# Patient Record
Sex: Male | Born: 1970 | Race: Black or African American | Hispanic: No | Marital: Single | Smoking: Never smoker
Health system: Southern US, Community
[De-identification: ages and names within clinical notes are randomized; demographics above are authoritative.]

## PROBLEM LIST (undated history)

## (undated) DIAGNOSIS — E119 Type 2 diabetes mellitus without complications: Secondary | ICD-10-CM

---

## 2016-07-19 ENCOUNTER — Emergency Department (HOSPITAL_BASED_OUTPATIENT_CLINIC_OR_DEPARTMENT_OTHER): Payer: 59

## 2016-07-19 ENCOUNTER — Encounter (HOSPITAL_BASED_OUTPATIENT_CLINIC_OR_DEPARTMENT_OTHER): Payer: Self-pay | Admitting: Emergency Medicine

## 2016-07-19 ENCOUNTER — Observation Stay (HOSPITAL_BASED_OUTPATIENT_CLINIC_OR_DEPARTMENT_OTHER)
Admission: EM | Admit: 2016-07-19 | Discharge: 2016-07-21 | Disposition: A | Discharge status: Home / Self Care | Payer: 59 | Attending: Surgery | Admitting: Surgery

## 2016-07-19 DIAGNOSIS — Z79899 Other long term (current) drug therapy: Secondary | ICD-10-CM | POA: Insufficient documentation

## 2016-07-19 DIAGNOSIS — E785 Hyperlipidemia, unspecified: Secondary | ICD-10-CM | POA: Insufficient documentation

## 2016-07-19 DIAGNOSIS — Z7982 Long term (current) use of aspirin: Secondary | ICD-10-CM | POA: Diagnosis not present

## 2016-07-19 DIAGNOSIS — N400 Enlarged prostate without lower urinary tract symptoms: Secondary | ICD-10-CM | POA: Insufficient documentation

## 2016-07-19 DIAGNOSIS — Z7984 Long term (current) use of oral hypoglycemic drugs: Secondary | ICD-10-CM | POA: Insufficient documentation

## 2016-07-19 DIAGNOSIS — K3580 Unspecified acute appendicitis: Secondary | ICD-10-CM | POA: Diagnosis not present

## 2016-07-19 DIAGNOSIS — E119 Type 2 diabetes mellitus without complications: Secondary | ICD-10-CM | POA: Diagnosis not present

## 2016-07-19 DIAGNOSIS — K37 Unspecified appendicitis: Secondary | ICD-10-CM | POA: Diagnosis present

## 2016-07-19 DIAGNOSIS — R109 Unspecified abdominal pain: Secondary | ICD-10-CM | POA: Diagnosis present

## 2016-07-19 HISTORY — DX: Type 2 diabetes mellitus without complications: E11.9

## 2016-07-19 LAB — URINE MICROSCOPIC-ADD ON: Bacteria, UA: NONE SEEN

## 2016-07-19 LAB — BASIC METABOLIC PANEL
Anion gap: 10 (ref 5–15)
BUN: 12 mg/dL (ref 6–20)
CALCIUM: 9.3 mg/dL (ref 8.9–10.3)
CHLORIDE: 95 mmol/L — AB (ref 101–111)
CO2: 27 mmol/L (ref 22–32)
CREATININE: 0.82 mg/dL (ref 0.61–1.24)
GFR calc Af Amer: >60 mL/min (ref >60)
GFR calc non Af Amer: >60 mL/min (ref >60)
Glucose, Bld: 291 mg/dL — ABNORMAL HIGH (ref 65–99)
Potassium: 4.3 mmol/L (ref 3.5–5.1)
SODIUM: 132 mmol/L — AB (ref 135–145)

## 2016-07-19 LAB — CBC WITH DIFFERENTIAL/PLATELET
BASOS PCT: 0 %
Basophils Absolute: 0 10*3/uL (ref 0.0–0.1)
EOS ABS: 0 10*3/uL (ref 0.0–0.7)
Eosinophils Relative: 0 %
HEMATOCRIT: 44 % (ref 39.0–52.0)
HEMOGLOBIN: 15.6 g/dL (ref 13.0–17.0)
LYMPHS ABS: 1.5 10*3/uL (ref 0.7–4.0)
Lymphocytes Relative: 11 %
MCH: 26.8 pg (ref 26.0–34.0)
MCHC: 35.5 g/dL (ref 30.0–36.0)
MCV: 75.6 fL — ABNORMAL LOW (ref 78.0–100.0)
MONO ABS: 1.2 10*3/uL — AB (ref 0.1–1.0)
MONOS PCT: 9 %
NEUTROS PCT: 80 %
Neutro Abs: 10.5 10*3/uL — ABNORMAL HIGH (ref 1.7–7.7)
Platelets: 349 10*3/uL (ref 150–400)
RBC: 5.82 MIL/uL — ABNORMAL HIGH (ref 4.22–5.81)
RDW: 12.4 % (ref 11.5–15.5)
WBC: 13.2 10*3/uL — ABNORMAL HIGH (ref 4.0–10.5)

## 2016-07-19 LAB — URINALYSIS, ROUTINE W REFLEX MICROSCOPIC
BILIRUBIN URINE: NEGATIVE
KETONES UR: 40 mg/dL — AB
Leukocytes, UA: NEGATIVE
Nitrite: NEGATIVE
PH: 5.5 (ref 5.0–8.0)
Protein, ur: 30 mg/dL — AB
SPECIFIC GRAVITY, URINE: 1.044 — AB (ref 1.005–1.030)

## 2016-07-19 MED ORDER — SODIUM CHLORIDE 0.9 % IV SOLN
Freq: Once | INTRAVENOUS | Status: AC
  Administered 2016-07-19: 23:00:00 via INTRAVENOUS

## 2016-07-19 MED ORDER — ONDANSETRON HCL 4 MG/2ML IJ SOLN
4.0000 mg | Freq: Once | INTRAMUSCULAR | Status: AC
  Administered 2016-07-19: 4 mg via INTRAVENOUS
  Filled 2016-07-19: qty 2

## 2016-07-19 MED ORDER — FENTANYL CITRATE (PF) 100 MCG/2ML IJ SOLN
100.0000 ug | Freq: Once | INTRAMUSCULAR | Status: AC
  Administered 2016-07-19: 100 ug via INTRAVENOUS
  Filled 2016-07-19: qty 2

## 2016-07-19 NOTE — ED Provider Notes (Addendum)
[facility] Provider Note   CSN: [id number] Arrival date & time: 07/19/16  2031 By signing my name below, I, Bridgette Habermann, attest that this documentation has been prepared under the direction and in the presence of Paula Libra, MD. Electronically Signed: Bridgette Habermann, ED Scribe. 07/19/16. 11:05 PM.  History   Chief Complaint Chief Complaint  Patient presents with  . Abdominal Pain    HPI Comments: Richard Flowers is a 45 y.o. male who presents to the Emergency Department complaining of gradually worsening, 8/10 cramping RLQ abdominal pain onset last night around midnight. Pt states pain is exacerbated with Movement or palpation palpation. Pt also has associated intermittent nausea and vomiting x1 episode. Pain is improved by lying still. Denies dysuria, hematuria, fever, diarrhea diaphoresis or chills.  The history is provided by the patient. No language interpreter was used.    Past Medical History:  Diagnosis Date  . Diabetes mellitus without complication (HCC)     There are no active problems to display for this patient.  History reviewed. No pertinent surgical history.   Home Medications    Prior to Admission medications   Medication Sig Start Date End Date Taking? Authorizing Provider  metFORMIN (GLUCOPHAGE) 500 MG tablet Take by mouth 2 (two) times daily with a meal.   Yes Historical Provider, MD    Family History History reviewed. No pertinent family history.  Social History Social History  Substance Use Topics  . Smoking status: Never Smoker  . Smokeless tobacco: Never Used  . Alcohol use Yes     Allergies   Review of patient's allergies indicates no known allergies.   Review of Systems Review of Systems  10 Systems reviewed and all are negative for acute change except as noted in the HPI.  Physical Exam Updated Vital Signs BP 120/82   Pulse 86   Temp 99.2 F (37.3 C) (Oral)   Resp 20   Ht 5\' 11"  (1.803 m)   Wt 194 lb (88 kg)   SpO2 93%    BMI 27.06 kg/m   Physical Exam General: Well-developed, well-nourished male in no acute distress; appearance consistent with age of record HENT: normocephalic; atraumatic Eyes: pupils equal, round and reactive to light; extraocular muscles intact Neck: supple Heart: regular rate and rhythm Lungs: clear to auscultation bilaterally Abdomen: soft; nondistended; RLQ tenderness without rebound; no masses or hepatosplenomegaly; bowel sounds present Extremities: No deformity; full range of motion; pulses normal Neurologic: Awake, alert and oriented; motor function intact in all extremities and symmetric; no facial droop Skin: Warm and dry Psychiatric: Normal mood and affect  ED Treatments / Results   Nursing notes and vitals signs, including pulse oximetry, reviewed.  Summary of this visit's results, reviewed by myself:  Labs:  Results for orders placed or performed during the hospital encounter of 07/19/16 (from the past 24 hour(s))  CBC with Differential/Platelet     Status: Abnormal   Collection Time: 07/19/16 11:10 PM  Result Value Ref Range   WBC 13.2 (H) 4.0 - 10.5 K/uL   RBC 5.82 (H) 4.22 - 5.81 MIL/uL   Hemoglobin 15.6 13.0 - 17.0 g/dL   HCT 78.2 95.6 - 21.3 %   MCV 75.6 (L) 78.0 - 100.0 fL   MCH 26.8 26.0 - 34.0 pg   MCHC 35.5 30.0 - 36.0 g/dL   RDW 08.6 57.8 - 46.9 %   Platelets 349 150 - 400 K/uL   Neutrophils Relative % 80 %   Neutro Abs 10.5 (H)  1.7 - 7.7 K/uL   Lymphocytes Relative 11 %   Lymphs Abs 1.5 0.7 - 4.0 K/uL   Monocytes Relative 9 %   Monocytes Absolute 1.2 (H) 0.1 - 1.0 K/uL   Eosinophils Relative 0 %   Eosinophils Absolute 0.0 0.0 - 0.7 K/uL   Basophils Relative 0 %   Basophils Absolute 0.0 0.0 - 0.1 K/uL  Basic metabolic panel     Status: Abnormal   Collection Time: 07/19/16 11:10 PM  Result Value Ref Range   Sodium 132 (L) 135 - 145 mmol/L   Potassium 4.3 3.5 - 5.1 mmol/L   Chloride 95 (L) 101 - 111 mmol/L   CO2 27 22 - 32 mmol/L   Glucose,  Bld 291 (H) 65 - 99 mg/dL   BUN 12 6 - 20 mg/dL   Creatinine, Ser 1.61 0.61 - 1.24 mg/dL   Calcium 9.3 8.9 - 09.6 mg/dL   GFR calc non Af Amer >60 >60 mL/min   GFR calc Af Amer >60 >60 mL/min   Anion gap 10 5 - 15  Urinalysis, Routine w reflex microscopic (not at [facility])     Status: Abnormal   Collection Time: 07/19/16 11:15 PM  Result Value Ref Range   Color, Urine YELLOW YELLOW   APPearance CLEAR CLEAR   Specific Gravity, Urine 1.044 (H) 1.005 - 1.030   pH 5.5 5.0 - 8.0   Glucose, UA >1000 (A) NEGATIVE mg/dL   Hgb urine dipstick SMALL (A) NEGATIVE   Bilirubin Urine NEGATIVE NEGATIVE   Ketones, ur 40 (A) NEGATIVE mg/dL   Protein, ur 30 (A) NEGATIVE mg/dL   Nitrite NEGATIVE NEGATIVE   Leukocytes, UA NEGATIVE NEGATIVE  Urine microscopic-add on     Status: Abnormal   Collection Time: 07/19/16 11:15 PM  Result Value Ref Range   Squamous Epithelial / LPF 0-5 (A) NONE SEEN   WBC, UA 0-5 0 - 5 WBC/hpf   RBC / HPF 0-5 0 - 5 RBC/hpf   Bacteria, UA NONE SEEN NONE SEEN    Imaging Studies: Ct Abdomen Pelvis W Contrast  Result Date: 07/20/2016 CLINICAL DATA:  Acute onset right lower quadrant abdominal pain and cramping. Nausea and vomiting. Mild leukocytosis. Initial encounter. EXAM: CT ABDOMEN AND PELVIS WITH CONTRAST TECHNIQUE: Multidetector CT imaging of the abdomen and pelvis was performed using the standard protocol following bolus administration of intravenous contrast. CONTRAST:  ISOVUE-300 IOPAMIDOL (ISOVUE-300) INJECTION 61% COMPARISON:  None. FINDINGS: Minimal bibasilar atelectasis is noted. The liver and spleen are unremarkable in appearance. The gallbladder is within normal limits. The pancreas and adrenal glands are unremarkable. Small bilateral renal cysts are noted. There is no evidence of hydronephrosis. No renal or ureteral stones are seen. No perinephric stranding is appreciated. No free fluid is identified. The small bowel is unremarkable in appearance. The stomach is  within normal limits. No acute vascular abnormalities are seen. Minimal calcification is seen along the abdominal aorta and its branches. The appendix is dilated to 1.2 cm in diameter, with surrounding soft tissue inflammation and trace fluid, compatible with acute appendicitis. The appendix is partially retrocecal in nature. There is no evidence of perforation or abscess formation at this time. The colon is partially filled with stool and is unremarkable in appearance. The bladder is moderately distended and grossly unremarkable. The prostate is mildly enlarged, measuring 5.0 cm in transverse dimension. No inguinal lymphadenopathy is seen. No acute osseous abnormalities are identified. IMPRESSION: 1. Acute appendicitis, with dilatation of the appendix to 1.2 cm  in diameter, with surrounding soft tissue inflammation and trace fluid. The appendix is partially retrocecal in nature. No evidence of perforation or abscess formation at this time. 2. Mildly enlarged prostate noted. 3. Small bilateral renal cysts seen. These results were called by telephone at the time of interpretation on 07/20/2016 at 1:00 am to Dr. Paula LibraJOHN Bengie Kaucher, who verbally acknowledged these results. Electronically Signed   By: Roanna RaiderJeffery  Chang M.D.   On: 07/20/2016 01:00    Procedures (including critical care time)   Final Clinical Impressions(s) / ED Diagnoses  1:14 AM Antibiotics ordered for acute appendicitis. Dr. Ovidio Kinavid Newman [facility]  Final diagnoses:  Uncomplicated acute appendicitis   I personally performed the services described in this documentation, which was scribed in my presence. The recorded information has been reviewed and is accurate.    Paula LibraJohn Ellysa Parrack, MD 07/20/16 [id number]    Paula LibraJohn Blyss Lugar, MD 07/20/16 [id number]

## 2016-07-19 NOTE — ED Triage Notes (Signed)
Pt states abd pain since last night with vomiting, pt NAD in traige

## 2016-07-19 NOTE — ED Notes (Signed)
C/o RUQ pain, onset last night. Last ate last night. Vomited x1 at 1030. Tried gas-x and a laxative at 1330 with some relief. Last BM at 1600 (hard). (denies: fever, vomiting, diarrhea, back pain, bleeding or other sx). Alert, NAD, calm, interactive. [facility] Read [facility]rivers[facility]

## 2016-07-20 ENCOUNTER — Emergency Department (HOSPITAL_COMMUNITY): Payer: 59 | Admitting: Anesthesiology

## 2016-07-20 ENCOUNTER — Encounter (HOSPITAL_COMMUNITY)
Admission: EM | Disposition: A | Discharge status: Home / Self Care | Payer: Self-pay | Source: Home / Self Care | Attending: Emergency Medicine

## 2016-07-20 ENCOUNTER — Encounter (HOSPITAL_COMMUNITY): Payer: Self-pay | Admitting: Anesthesiology

## 2016-07-20 DIAGNOSIS — K37 Unspecified appendicitis: Secondary | ICD-10-CM | POA: Diagnosis present

## 2016-07-20 HISTORY — PX: LAPAROSCOPIC APPENDECTOMY: SHX408

## 2016-07-20 LAB — GLUCOSE, CAPILLARY
GLUCOSE-CAPILLARY: 245 mg/dL — AB (ref 65–99)
GLUCOSE-CAPILLARY: 274 mg/dL — AB (ref 65–99)
GLUCOSE-CAPILLARY: 268 mg/dL — AB (ref 65–99)
GLUCOSE-CAPILLARY: 288 mg/dL — AB (ref 65–99)
Glucose-Capillary: 318 mg/dL — ABNORMAL HIGH (ref 65–99)

## 2016-07-20 LAB — CBG MONITORING, ED: GLUCOSE-CAPILLARY: 284 mg/dL — AB (ref 65–99)

## 2016-07-20 SURGERY — APPENDECTOMY, LAPAROSCOPIC
Anesthesia: General | Site: Abdomen

## 2016-07-20 MED ORDER — FENTANYL CITRATE (PF) 250 MCG/5ML IJ SOLN
INTRAMUSCULAR | Status: AC
  Filled 2016-07-20: qty 5

## 2016-07-20 MED ORDER — ONDANSETRON 4 MG PO TBDP: 4.0000 mg | ORAL_TABLET | Freq: Four times a day (QID) | ORAL | Status: DC | PRN

## 2016-07-20 MED ORDER — ONDANSETRON HCL 4 MG/2ML IJ SOLN
INTRAMUSCULAR | Status: AC
  Filled 2016-07-20: qty 2

## 2016-07-20 MED ORDER — IOPAMIDOL (ISOVUE-300) INJECTION 61%
100.0000 mL | Freq: Once | INTRAVENOUS | Status: AC | PRN
  Administered 2016-07-20: 100 mL via INTRAVENOUS

## 2016-07-20 MED ORDER — 0.9 % SODIUM CHLORIDE (POUR BTL) OPTIME
TOPICAL | Status: DC | PRN
  Administered 2016-07-20: 1000 mL

## 2016-07-20 MED ORDER — MIDAZOLAM HCL 2 MG/2ML IJ SOLN
INTRAMUSCULAR | Status: AC
  Filled 2016-07-20: qty 2

## 2016-07-20 MED ORDER — HYDROCODONE-ACETAMINOPHEN 5-325 MG PO TABS
1.0000 | ORAL_TABLET | ORAL | Status: DC | PRN
  Administered 2016-07-20: 2 via ORAL
  Administered 2016-07-21: 1 via ORAL
  Filled 2016-07-20: qty 2
  Filled 2016-07-20 (×2): qty 1

## 2016-07-20 MED ORDER — FENTANYL CITRATE (PF) 100 MCG/2ML IJ SOLN
INTRAMUSCULAR | Status: DC | PRN
  Administered 2016-07-20: 100 ug via INTRAVENOUS
  Administered 2016-07-20 (×2): 50 ug via INTRAVENOUS

## 2016-07-20 MED ORDER — SODIUM CHLORIDE 0.9 % IV BOLUS (SEPSIS): 1000.0000 mL | Freq: Once | INTRAVENOUS | Status: DC

## 2016-07-20 MED ORDER — KCL IN DEXTROSE-NACL 20-5-0.45 MEQ/L-%-% IV SOLN
INTRAVENOUS | Status: DC
  Administered 2016-07-20: 100 mL/h via INTRAVENOUS
  Administered 2016-07-20: 11:00:00 via INTRAVENOUS
  Filled 2016-07-20 (×3): qty 1000

## 2016-07-20 MED ORDER — PROPOFOL 10 MG/ML IV BOLUS
INTRAVENOUS | Status: DC | PRN
  Administered 2016-07-20: 200 mg via INTRAVENOUS

## 2016-07-20 MED ORDER — METFORMIN HCL 500 MG PO TABS
1000.0000 mg | ORAL_TABLET | Freq: Two times a day (BID) | ORAL | Status: DC
  Administered 2016-07-20: 1000 mg via ORAL
  Filled 2016-07-20 (×2): qty 2

## 2016-07-20 MED ORDER — HEPARIN SODIUM (PORCINE) 5000 UNIT/ML IJ SOLN
5000.0000 [IU] | Freq: Three times a day (TID) | INTRAMUSCULAR | Status: DC
  Administered 2016-07-20 – 2016-07-21 (×3): 5000 [IU] via SUBCUTANEOUS
  Filled 2016-07-20 (×2): qty 1

## 2016-07-20 MED ORDER — PROPOFOL 10 MG/ML IV BOLUS
INTRAVENOUS | Status: AC
  Filled 2016-07-20: qty 20

## 2016-07-20 MED ORDER — IBUPROFEN 200 MG PO TABS: 600.0000 mg | ORAL_TABLET | Freq: Four times a day (QID) | ORAL | Status: DC | PRN

## 2016-07-20 MED ORDER — HYDROMORPHONE HCL 1 MG/ML IJ SOLN: 0.2500 mg | INTRAMUSCULAR | Status: DC | PRN

## 2016-07-20 MED ORDER — SUGAMMADEX SODIUM 200 MG/2ML IV SOLN
INTRAVENOUS | Status: DC | PRN
  Administered 2016-07-20: 200 mg via INTRAVENOUS

## 2016-07-20 MED ORDER — ONDANSETRON HCL 4 MG/2ML IJ SOLN
INTRAMUSCULAR | Status: DC | PRN
  Administered 2016-07-20: 4 mg via INTRAVENOUS

## 2016-07-20 MED ORDER — ONDANSETRON HCL 4 MG/2ML IJ SOLN: 4.0000 mg | Freq: Four times a day (QID) | INTRAMUSCULAR | Status: DC | PRN

## 2016-07-20 MED ORDER — BUPIVACAINE-EPINEPHRINE 0.25% -1:200000 IJ SOLN
INTRAMUSCULAR | Status: DC | PRN
  Administered 2016-07-20: 30 mL

## 2016-07-20 MED ORDER — LACTATED RINGERS IR SOLN
Status: DC | PRN
  Administered 2016-07-20: 1000 mL

## 2016-07-20 MED ORDER — LIDOCAINE 2% (20 MG/ML) 5 ML SYRINGE
INTRAMUSCULAR | Status: AC
  Filled 2016-07-20: qty 10

## 2016-07-20 MED ORDER — METRONIDAZOLE IN NACL 5-0.79 MG/ML-% IV SOLN
500.0000 mg | Freq: Once | INTRAVENOUS | Status: AC
  Administered 2016-07-20: 500 mg via INTRAVENOUS
  Filled 2016-07-20: qty 100

## 2016-07-20 MED ORDER — SODIUM CHLORIDE 0.9 % IV SOLN
INTRAVENOUS | Status: DC | PRN
  Administered 2016-07-20 (×2): via INTRAVENOUS

## 2016-07-20 MED ORDER — MORPHINE SULFATE (PF) 2 MG/ML IV SOLN: 1.0000 mg | INTRAVENOUS | Status: DC | PRN

## 2016-07-20 MED ORDER — ROCURONIUM BROMIDE 10 MG/ML (PF) SYRINGE
PREFILLED_SYRINGE | INTRAVENOUS | Status: DC | PRN
  Administered 2016-07-20: 20 mg via INTRAVENOUS
  Administered 2016-07-20: 30 mg via INTRAVENOUS

## 2016-07-20 MED ORDER — DEXTROSE 5 % IV SOLN
2.0000 g | INTRAVENOUS | Status: DC
  Administered 2016-07-21: 2 g via INTRAVENOUS
  Filled 2016-07-20: qty 2

## 2016-07-20 MED ORDER — GLYCOPYRROLATE 0.2 MG/ML IV SOSY
PREFILLED_SYRINGE | INTRAVENOUS | Status: AC
  Filled 2016-07-20: qty 3

## 2016-07-20 MED ORDER — MEPERIDINE HCL 50 MG/ML IJ SOLN: 6.2500 mg | INTRAMUSCULAR | Status: DC | PRN

## 2016-07-20 MED ORDER — INSULIN ASPART 100 UNIT/ML ~~LOC~~ SOLN
0.0000 [IU] | Freq: Three times a day (TID) | SUBCUTANEOUS | Status: DC
  Administered 2016-07-20: 11 [IU] via SUBCUTANEOUS
  Administered 2016-07-20: 8 [IU] via SUBCUTANEOUS
  Administered 2016-07-21: 5 [IU] via SUBCUTANEOUS

## 2016-07-20 MED ORDER — DEXTROSE 5 % IV SOLN
2.0000 g | Freq: Once | INTRAVENOUS | Status: AC
Start: 2016-07-20 — End: 2016-07-20
  Administered 2016-07-20: 2 g via INTRAVENOUS
  Filled 2016-07-20: qty 2

## 2016-07-20 MED ORDER — METOCLOPRAMIDE HCL 5 MG/ML IJ SOLN
INTRAMUSCULAR | Status: AC
  Filled 2016-07-20: qty 2

## 2016-07-20 MED ORDER — BUPIVACAINE-EPINEPHRINE 0.25% -1:200000 IJ SOLN
INTRAMUSCULAR | Status: AC
  Filled 2016-07-20: qty 1

## 2016-07-20 MED ORDER — MIDAZOLAM HCL 5 MG/5ML IJ SOLN
INTRAMUSCULAR | Status: DC | PRN
  Administered 2016-07-20: 2 mg via INTRAVENOUS

## 2016-07-20 MED ORDER — FENTANYL CITRATE (PF) 100 MCG/2ML IJ SOLN
100.0000 ug | INTRAMUSCULAR | Status: DC | PRN
  Administered 2016-07-20: 100 ug via INTRAVENOUS
  Filled 2016-07-20: qty 2

## 2016-07-20 MED ORDER — METOCLOPRAMIDE HCL 5 MG/ML IJ SOLN
INTRAMUSCULAR | Status: DC | PRN
  Administered 2016-07-20: 10 mg via INTRAVENOUS

## 2016-07-20 MED ORDER — METRONIDAZOLE IN NACL 5-0.79 MG/ML-% IV SOLN
500.0000 mg | Freq: Three times a day (TID) | INTRAVENOUS | Status: DC
  Administered 2016-07-20 – 2016-07-21 (×3): 500 mg via INTRAVENOUS
  Filled 2016-07-20 (×3): qty 100

## 2016-07-20 MED ORDER — SUGAMMADEX SODIUM 200 MG/2ML IV SOLN
INTRAVENOUS | Status: AC
  Filled 2016-07-20: qty 2

## 2016-07-20 MED ORDER — LIDOCAINE 2% (20 MG/ML) 5 ML SYRINGE
INTRAMUSCULAR | Status: DC | PRN
  Administered 2016-07-20: 50 mg via INTRAVENOUS

## 2016-07-20 MED ORDER — PROMETHAZINE HCL 25 MG/ML IJ SOLN: 6.2500 mg | INTRAMUSCULAR | Status: DC | PRN

## 2016-07-20 MED ORDER — SUCCINYLCHOLINE CHLORIDE 200 MG/10ML IV SOSY
PREFILLED_SYRINGE | INTRAVENOUS | Status: DC | PRN
  Administered 2016-07-20: 100 mg via INTRAVENOUS

## 2016-07-20 SURGICAL SUPPLY — 35 items
APPLIER CLIP ROT 10 11.4 M/L (STAPLE)
BENZOIN TINCTURE PRP APPL 2/3 (GAUZE/BANDAGES/DRESSINGS) IMPLANT
CABLE HIGH FREQUENCY MONO STRZ (ELECTRODE) ×3 IMPLANT
CHLORAPREP W/TINT 26ML (MISCELLANEOUS) ×3 IMPLANT
CLIP APPLIE ROT 10 11.4 M/L (STAPLE) IMPLANT
CLOSURE WOUND 1/2 X4 (GAUZE/BANDAGES/DRESSINGS)
COVER SURGICAL LIGHT HANDLE (MISCELLANEOUS) ×3 IMPLANT
CUTTER FLEX LINEAR 45M (STAPLE) IMPLANT
DECANTER SPIKE VIAL GLASS SM (MISCELLANEOUS) ×3 IMPLANT
DRAPE LAPAROSCOPIC ABDOMINAL (DRAPES) ×3 IMPLANT
ELECT REM PT RETURN 9FT ADLT (ELECTROSURGICAL) ×3
ELECTRODE REM PT RTRN 9FT ADLT (ELECTROSURGICAL) ×1 IMPLANT
ENDOLOOP SUT PDS II  0 18 (SUTURE)
ENDOLOOP SUT PDS II 0 18 (SUTURE) IMPLANT
GLOVE SURG SIGNA 7.5 PF LTX (GLOVE) ×3 IMPLANT
GOWN STRL REUS W/TWL XL LVL3 (GOWN DISPOSABLE) ×6 IMPLANT
IRRIG SUCT STRYKERFLOW 2 WTIP (MISCELLANEOUS) ×3
IRRIGATION SUCT STRKRFLW 2 WTP (MISCELLANEOUS) ×1 IMPLANT
KIT BASIN OR (CUSTOM PROCEDURE TRAY) ×3 IMPLANT
LIQUID BAND (GAUZE/BANDAGES/DRESSINGS) ×3 IMPLANT
POUCH SPECIMEN RETRIEVAL 10MM (ENDOMECHANICALS) ×3 IMPLANT
RELOAD 45 VASCULAR/THIN (ENDOMECHANICALS) ×3 IMPLANT
RELOAD STAPLE TA45 3.5 REG BLU (ENDOMECHANICALS) IMPLANT
SCISSORS LAP 5X35 DISP (ENDOMECHANICALS) ×3 IMPLANT
SHEARS HARMONIC ACE PLUS 36CM (ENDOMECHANICALS) ×3 IMPLANT
SLEEVE XCEL OPT CAN 5 100 (ENDOMECHANICALS) ×3 IMPLANT
STRIP CLOSURE SKIN 1/2X4 (GAUZE/BANDAGES/DRESSINGS) IMPLANT
SUT MNCRL AB 4-0 PS2 18 (SUTURE) ×3 IMPLANT
SUT VIC AB 2-0 SH 18 (SUTURE) IMPLANT
TOWEL OR 17X26 10 PK STRL BLUE (TOWEL DISPOSABLE) ×3 IMPLANT
TOWEL OR NON WOVEN STRL DISP B (DISPOSABLE) ×3 IMPLANT
TRAY FOLEY W/METER SILVER 14FR (SET/KITS/TRAYS/PACK) ×3 IMPLANT
TRAY LAPAROSCOPIC (CUSTOM PROCEDURE TRAY) ×3 IMPLANT
TROCAR BLADELESS OPT 5 100 (ENDOMECHANICALS) ×3 IMPLANT
TROCAR XCEL BLUNT TIP 100MML (ENDOMECHANICALS) ×3 IMPLANT

## 2016-07-20 NOTE — ED Notes (Signed)
Pt to surgery with signed consent no respiratory or acute distress noted alert and oriented x 3 family with pt to surgery no reaction to medication noted.

## 2016-07-20 NOTE — Anesthesia Preprocedure Evaluation (Signed)
Anesthesia Evaluation  Patient identified by MRN, date of birth, ID band Patient awake    Reviewed: Allergy & Precautions, NPO status , Patient's Chart, lab work & pertinent test results  Airway Mallampati: II  TM Distance: >3 FB Neck ROM: Full    Dental no notable dental hx. (+) Teeth Intact   Pulmonary neg pulmonary ROS,    Pulmonary exam normal breath sounds clear to auscultation       Cardiovascular negative cardio ROS Normal cardiovascular exam Rhythm:Regular Rate:Normal     Neuro/Psych negative neurological ROS  negative psych ROS   GI/Hepatic Acute Appendicitis   Endo/Other  diabetes, Poorly Controlled, Type 2, Oral Hypoglycemic AgentsHyperlipidemia  Renal/GU   negative genitourinary   Musculoskeletal negative musculoskeletal ROS (+)   Abdominal   Peds  Hematology negative hematology ROS (+)   Anesthesia Other Findings   Reproductive/Obstetrics                               Chemistry      Component Value Date/Time   NA 132 (L) 07/19/2016 2310   K 4.3 07/19/2016 2310   CL 95 (L) 07/19/2016 2310   CO2 27 07/19/2016 2310   BUN 12 07/19/2016 2310   CREATININE 0.82 07/19/2016 2310      Component Value Date/Time   CALCIUM 9.3 07/19/2016 2310     Lab Results  Component Value Date   WBC 13.2 (H) 07/19/2016   HGB 15.6 07/19/2016   HCT 44.0 07/19/2016   MCV 75.6 (L) 07/19/2016   PLT 349 07/19/2016    Anesthesia Physical Anesthesia Plan  ASA: II and emergent  Anesthesia Plan: General   Post-op Pain Management:    Induction: Intravenous, Rapid sequence and Cricoid pressure planned  Airway Management Planned: Oral ETT  Additional Equipment:   Intra-op Plan:   Post-operative Plan: Extubation in OR  Informed Consent: I have reviewed the patients History and Physical, chart, labs and discussed the procedure including the risks, benefits and alternatives for the  proposed anesthesia with the patient or authorized representative who has indicated his/her understanding and acceptance.   Dental advisory given  Plan Discussed with: CRNA, Anesthesiologist and Surgeon  Anesthesia Plan Comments:         Anesthesia Quick Evaluation

## 2016-07-20 NOTE — Anesthesia Procedure Notes (Signed)
Procedure Name: Intubation Date/Time: 07/20/2016 7:20 AM Performed by: Ludwig LeanJONES, Shanine Kreiger C Pre-anesthesia Checklist: Patient identified, Emergency Drugs available, Suction available and Patient being monitored Patient Re-evaluated:Patient Re-evaluated prior to inductionOxygen Delivery Method: Circle system utilized Preoxygenation: Pre-oxygenation with 100% oxygen Intubation Type: IV induction, Rapid sequence and Cricoid Pressure applied Laryngoscope Size: Mac and 4 Grade View: Grade I Tube type: Oral Tube size: 7.5 mm Number of attempts: 1 Airway Equipment and Method: Oral airway Placement Confirmation: ETT inserted through vocal cords under direct vision,  positive ETCO2 and breath sounds checked- equal and bilateral Secured at: 22 cm Tube secured with: Tape Dental Injury: Teeth and Oropharynx as per pre-operative assessment

## 2016-07-20 NOTE — Anesthesia Postprocedure Evaluation (Signed)
Anesthesia Post Note  Patient: Richard ClicheAdmonda L Flowers  Procedure(s) Performed: Procedure(s) (LRB): APPENDECTOMY LAPAROSCOPIC (N/A)  Patient location during evaluation: PACU Anesthesia Type: General Level of consciousness: sedated Pain management: satisfactory to patient Vital Signs Assessment: post-procedure vital signs reviewed and stable Respiratory status: spontaneous breathing Cardiovascular status: stable Anesthetic complications: no    Last Vitals:  Vitals:   07/20/16 0936 07/20/16 1035  BP: 137/82 126/76  Pulse: 98 99  Resp: 16 16  Temp: 36.9 C 36.4 C    Last Pain:  Vitals:   07/20/16 1035  TempSrc: Oral  PainSc:                  Jiles GarterJACKSON,[name] [name]

## 2016-07-20 NOTE — ED Notes (Signed)
Pt cleaned self with chg wipes with new gown and new non slip socks.

## 2016-07-20 NOTE — Transfer of Care (Signed)
Immediate Anesthesia Transfer of Care Note  Patient: Richard Flowers  Procedure(s) Performed: Procedure(s): APPENDECTOMY LAPAROSCOPIC (N/A)  Patient Location: PACU  Anesthesia Type:General  Level of Consciousness: Patient easily awoken, sedated, comfortable, cooperative, following commands, responds to stimulation.   Airway & Oxygen Therapy: Patient spontaneously breathing, ventilating well, oxygen via simple oxygen mask.  Post-op Assessment: Report given to PACU RN, vital signs reviewed and stable, moving all extremities.   Post vital signs: Reviewed and stable.  Complications: No apparent anesthesia complications Last Vitals:  Vitals:   07/20/16 0413 07/20/16 0548  BP: 128/85 119/73  Pulse: 80 79  Resp: 18 18  Temp: 36.9 C 36.7 C    Last Pain:  Vitals:   07/20/16 0548  TempSrc: Oral  PainSc: 2          Complications: No apparent anesthesia complications

## 2016-07-20 NOTE — H&P (Signed)
Re:   Richard Flowers DOB:   January 20, 1971 MRN:   [id number]   [facility] Admission  ASSESSMENT AND PLAN: 1.  Appendicitis  I discussed with the patient the indications and risks of appendiceal surgery.  The primary risks of appendiceal surgery include, but are not limited to, bleeding, infection, bowel surgery, and open surgery.  There is also the risk that the patient may have continued symptoms after surgery.  We discussed the typical post-operative recovery course. I tried to answer the patient's questions.  2.  DM x 8 years  On pills only 3.  BPH   Chief Complaint  Patient presents with  . Abdominal Pain   REFERRING PHYSICIAN:  Dr. Lannette DonathLane Malpus, [facility]  HISTORY OF PRESENT ILLNESS: Richard Flowers is a 45 y.o. (DOB: January 20, 1971)  AA male whose [facility]. Girlfriend, Richard Flowers, and mother, Richard Flowers, at bedside.  He started having pain Saturday night.  He vomited x 3 Sunday AM.  He thought he may be constipated and took some oil laxative.  He has had no prior abdominal surgery.  He has no chronic GI complaints - no stomach, liver, or colon disease.  CT scan of abdomen - 07/20/2016 - 1. Acute appendicitis, with dilatation of the appendix to 1.2 cm in diameter, with surrounding soft tissue inflammation and trace fluid. The appendix is partially retrocecal in nature. No evidence of perforation or abscess formation at this time.  2. Mildly enlarged prostate noted.  3. Small bilateral renal cysts seen. WBC - 07/19/2016 - 13,200    Past Medical History:  Diagnosis Date  . Diabetes mellitus without complication (HCC)      History reviewed. No pertinent surgical history.    Current Facility-Administered Medications  Medication Dose Route Frequency Provider Last Rate Last Dose  . fentaNYL (SUBLIMAZE) injection 100 mcg  100 mcg Intravenous Q2H PRN Paula LibraJohn Molpus, MD   100 mcg at  07/20/16 [id number]   Current Outpatient Prescriptions  Medication Sig Dispense Refill  . aspirin EC 81 MG tablet Take 81 mg by mouth daily.    . metFORMIN (GLUCOPHAGE) 1000 MG tablet Take 1,000 mg by mouth 2 (two) times daily.     . pravastatin (PRAVACHOL) 40 MG tablet        No Known Allergies  REVIEW OF SYSTEMS: Skin:  No history of rash.  No history of abnormal moles. Infection:  No history of hepatitis or HIV.  No history of MRSA. Neurologic:  No history of stroke.  No history of seizure.  No history of headaches. Cardiac:  No history of hypertension. No history of heart disease.   Pulmonary:  Does not smoke cigarettes.  No asthma or bronchitis.  No OSA/CPAP.  Endocrine:  DM x 8 years - on a pill. No thyroid disease. Gastrointestinal:  See HPI Urologic:  No history of kidney stones.  No history of bladder infections. Musculoskeletal:  No history of joint or back disease. Hematologic:  No bleeding disorder.  No history of anemia.  Not anticoagulated. Psycho-social:  The patient is oriented.   The patient has no obvious psychologic or social impairment to understanding our conversation and plan.  SOCIAL and FAMILY HISTORY: Unmarried.  Girlfriend, Richard Flowers, and mother, Richard Flowers, at bedside. He has 2 children - 44 yo son, and 44 yo daughter - [place] in [place]. He works for [employer], making pills.  PHYSICAL  EXAM: BP 119/73 (BP Location: Left Arm)   Pulse 79   Temp 98.1 F (36.7 C) (Oral)   Resp 18   Ht 5\' 11"  (1.803 m)   Wt 88 kg (194 lb)   SpO2 98%   BMI 27.06 kg/m   General: WN AA M who is alert and generally healthy appearing.  HEENT: Normal. Pupils equal. Neck: Supple. No mass.  No thyroid mass. Lymph Nodes:  No supraclavicular or cervical nodes. Lungs: Clear to auscultation and symmetric breath sounds. Heart:  RRR. No murmur or rub. Abdomen: Soft. No mass. No hernia. Decreased bowel sounds.  No abdominal scars.  Tender RLQ, but no rebound. Rectal: Not  done. Extremities:  Good strength and ROM  in upper and lower extremities. Neurologic:  Grossly intact to motor and sensory function. Psychiatric: Has normal mood and affect. Behavior is normal.   DATA REVIEWED: Epic notes  Ovidio Kin, MD,  [facility] [address] [place]  [address]   [place], [place]    [address] Phone:  [phone] FAX:  [phone]

## 2016-07-20 NOTE — Op Note (Signed)
Re:   Richard Flowers DOB:   Dec 28, 1970 MRN:   [id number]                   FACILITY:  [facility]  DATE OF PROCEDURE: 07/20/2016                              OPERATIVE REPORT  PREOPERATIVE DIAGNOSIS:  Appendicitis  POSTOPERATIVE DIAGNOSIS:  Acute purulent appendicitis, retroperitoneal appendix.  [photo in chart]  PROCEDURE:  Laparoscopic appendectomy.  SURGEON:  Sandria Bales. Ezzard Standing, MD  ASSISTANT:  No first assistant.  ANESTHESIA:  General endotracheal.  Anesthesiologist: Cristela Blue, MD CRNA: Ludwig Lean, CRNA  ASA:  2E  ESTIMATED BLOOD LOSS:  Minimal.  DRAINS: none   SPECIMEN:   Appendix  COUNTS CORRECT:  YES  INDICATIONS FOR PROCEDURE: Richard Flowers is a 45 y.o. (DOB: [birth year]) AA male whose primary care doctor is KELLY,SAM, MD and comes to the OR for an appendectomy.   I discussed with the patient, the indications and potential complications of appendiceal surgery.  The potential complications include, but are not limited to, bleeding, open surgery, bowel resection, and the possibility of another diagnosis.  OPERATIVE NOTE:  The patient underwent a general endotracheal anesthetic as supervised by Anesthesiologist: Cristela Blue, MD CRNA: Ludwig Lean, CRNA, General, in room #1.  The patient was given Rocephin and Flagyl prior to the beginning of the procedure and the abdomen was prepped with ChloraPrep.   A time-out was held and surgical checklist run.  An infraumbilical incision was made with sharp dissection carried down to the abdominal cavity.  An 12 mm Hasson trocar was inserted through the infraumbilical incision and into the peritoneal cavity.  A 0 degree 5 mm laparoscope was inserted through a 12 mm Hasson trocar and the Hasson trocar secured with a 0 Vicryl suture.  I placed a 5 mm trocar in the right upper quadrant and 5 mm torcar in left lower quadrant and did abdominal exploration.    The right and left lobes of liver unremarkable.  Stomach was unremarkable.   The pelvic organs were unremarkable.  I saw no other intra-abdominal abnormality.  The patient had purulent appendicitis with the appendix located in the retroperitoneum and partially retrocecal at the right pelvic brim.  The mesentery of the appendix was divided with a Harmonic scalpel.  I had to mobilize the cecum to access the base of the appendix.  I got to the base of the appendix.  I then used a blue load 45 mm Ethicon Endo-GIA stapler and fired this across the base of the appendix.  I placed the appendix in EndoCatch bag and delivered the bag through the umbilical incision.  I irrigated the abdomen with 1,000 cc of saline.  After irrigating the abdomen, I then removed the trocars, in turn.  The umbilical port fascia was closed with 0 Vicryl suture.   I closed the skin each site with a 4-0 Monocryl suture and painted the wounds with LiquidBand.  I then injected a total of 30 mL of 0.25% Marcaine at the incisions.  Sponge and needle count were correct at the end of the case.  The patient was transferred to the recovery room in good condition.  The patient tolerated the procedure well and it depends on the patient's post op clinical course as to when the patient could be discharged.   Ovidio Kin, MD, [facility]  Surgery Pager: [phone] Office phone:  [phone]

## 2016-07-20 NOTE — ED Notes (Signed)
[facility]

## 2016-07-20 NOTE — ED Notes (Signed)
No respiratory or acute distress noted alert and oriented x 3 visitors at bedside pt went to restroom.

## 2016-07-20 NOTE — ED Notes (Addendum)
Patient arrives by [facility] in stable condition. VS 98.7-87-12 125/84 and 98% RA. CBG 284 per [facility]

## 2016-07-20 NOTE — Progress Notes (Signed)
Metformin administered as ordered, pt's family concerned if pt should receive metformin due to possible interaction with contrast. MD was paged, made aware with new orders received and carried out. Meah Jiron R McClean

## 2016-07-20 NOTE — ED Notes (Signed)
No respiratory or acute distress noted alert and oriented x 3 no reaction to medication noted visitors at bedside. 

## 2016-07-20 NOTE — Progress Notes (Signed)
PHARMACIST - PHYSICIAN COMMUNICATION DR Lucia Gaskins CONCERNING:  METFORMIN SAFE ADMINISTRATION POLICY  RECOMMENDATION: Metformin has been placed on DISCONTINUE (rejected order) STATUS and should be reordered only after any of the conditions below are ruled out.  Current Safety recommendations include avoiding metformin for a minimum of 48 hours after the patient's exposure to intravenous contrast media for the following conditions:  . eGFR < 60 ml/min  . Liver disease, alcoholism, heart failure, intra-arterial administration of contrast  DESCRIPTION:  The Pharmacy Committee has adopted a policy that restricts the use of metformin in hospitalized patients until all the contraindications to administration have been ruled out. Specific contraindications are: _0  Serum creatinine ? 1.5 for males _1  Serum creatinine ? 1.4 for females _2  Shock, acute MI, sepsis, hypoxemia, dehydration _3  Planned administration of intravenous iodinated contrast media when eGFR < 46m/min, Liver Disease, alcoholism, heart failure or intra-arterial administration of contrast _4  Heart Failure patients with low EF _5  Acute or chronic metabolic acidosis (including DKA)   TRomeo Rabon PharmD, pager [phone] 07/20/2016,6:31 PM.

## 2016-07-21 ENCOUNTER — Encounter (HOSPITAL_COMMUNITY): Payer: Self-pay | Admitting: Surgery

## 2016-07-21 ENCOUNTER — Encounter: Payer: Self-pay | Admitting: General Surgery

## 2016-07-21 LAB — GLUCOSE, CAPILLARY: Glucose-Capillary: 236 mg/dL — ABNORMAL HIGH (ref 65–99)

## 2016-07-21 MED ORDER — METFORMIN HCL 1000 MG PO TABS: ORAL_TABLET | ORAL | Status: DC

## 2016-07-21 MED ORDER — METFORMIN HCL 1000 MG PO TABS: ORAL_TABLET | ORAL | Status: AC

## 2016-07-21 MED ORDER — ACETAMINOPHEN 325 MG PO TABS: ORAL_TABLET | ORAL | Status: AC

## 2016-07-21 MED ORDER — ACETAMINOPHEN 325 MG PO TABS: 650.0000 mg | ORAL_TABLET | ORAL | Status: DC | PRN

## 2016-07-21 MED ORDER — HYDROCODONE-ACETAMINOPHEN 5-325 MG PO TABS: 1.0000 | ORAL_TABLET | ORAL | 0 refills | Status: AC | PRN

## 2016-07-21 NOTE — Progress Notes (Signed)
1 Day Post-Op  Subjective: Some skin bleeding from umbilical site, I redressed with gauze.  Tolerating diet.  Pain controlled with PO pain med.    Objective: Vital signs in last 24 hours: Temp:  [97.6 F (36.4 C)-99.3 F (37.4 C)] 98.4 F (36.9 C) (09/05 0522) Pulse Rate:  [83-100] 83 (09/05 0522) Resp:  [16-18] 18 (09/05 0522) BP: (119-141)/(70-92) 119/71 (09/05 0522) SpO2:  [95 %-100 %] 98 % (09/05 0522) Last BM Date: 07/18/16 720 PO Voided x 5 Afebrile, VSS NO labs No radiology  Intake/Output from previous day: 09/04 0701 - 09/05 0700 In: 2883.3 [P.O.:720; I.V.:2063.3; IV Piggyback:100] Out: 10 [Blood:10] Intake/Output this shift: No intake/output data recorded.  General appearance: alert, cooperative and no distress Resp: clear to auscultation bilaterally GI: soft, sore, site ok some skin bleeding from umbilical site.  Lab Results:   Recent Labs  07/19/16 2310  WBC 13.2*  HGB 15.6  HCT 44.0  PLT 349    BMET  Recent Labs  07/19/16 2310  NA 132*  K 4.3  CL 95*  CO2 27  GLUCOSE 291*  BUN 12  CREATININE 0.82  CALCIUM 9.3   PT/INR No results for input(s): LABPROT, INR in the last 72 hours.  No results for input(s): AST, ALT, ALKPHOS, BILITOT, PROT, ALBUMIN in the last 168 hours.   Lipase  No results found for: LIPASE   Studies/Results: Ct Abdomen Pelvis W Contrast  Result Date: 07/20/2016 CLINICAL DATA:  Acute onset right lower quadrant abdominal pain and cramping. Nausea and vomiting. Mild leukocytosis. Initial encounter. EXAM: CT ABDOMEN AND PELVIS WITH CONTRAST TECHNIQUE: Multidetector CT imaging of the abdomen and pelvis was performed using the standard protocol following bolus administration of intravenous contrast. CONTRAST:  ISOVUE-300 IOPAMIDOL (ISOVUE-300) INJECTION 61% COMPARISON:  None. FINDINGS: Minimal bibasilar atelectasis is noted. The liver and spleen are unremarkable in appearance. The gallbladder is within normal limits. The  pancreas and adrenal glands are unremarkable. Small bilateral renal cysts are noted. There is no evidence of hydronephrosis. No renal or ureteral stones are seen. No perinephric stranding is appreciated. No free fluid is identified. The small bowel is unremarkable in appearance. The stomach is within normal limits. No acute vascular abnormalities are seen. Minimal calcification is seen along the abdominal aorta and its branches. The appendix is dilated to 1.2 cm in diameter, with surrounding soft tissue inflammation and trace fluid, compatible with acute appendicitis. The appendix is partially retrocecal in nature. There is no evidence of perforation or abscess formation at this time. The colon is partially filled with stool and is unremarkable in appearance. The bladder is moderately distended and grossly unremarkable. The prostate is mildly enlarged, measuring 5.0 cm in transverse dimension. No inguinal lymphadenopathy is seen. No acute osseous abnormalities are identified. IMPRESSION: 1. Acute appendicitis, with dilatation of the appendix to 1.2 cm in diameter, with surrounding soft tissue inflammation and trace fluid. The appendix is partially retrocecal in nature. No evidence of perforation or abscess formation at this time. 2. Mildly enlarged prostate noted. 3. Small bilateral renal cysts seen. These results were called by telephone at the time of interpretation on 07/20/2016 at 1:00 am to Dr. Paula Libra, who verbally acknowledged these results. Electronically Signed   By: Roanna Raider M.D.   On: 07/20/2016 01:00    Prior to Admission medications   Medication Sig Start Date End Date Taking? Authorizing Provider  aspirin EC 81 MG tablet Take 81 mg by mouth daily. 12/16/15 12/15/16 Yes Historical Provider,  MD  metFORMIN (GLUCOPHAGE) 1000 MG tablet Take 1,000 mg by mouth 2 (two) times daily.  06/16/16  Yes Historical Provider, MD  pravastatin (PRAVACHOL) 40 MG tablet  06/16/16  Yes Historical Provider, MD     Medications: . cefTRIAXone (ROCEPHIN)  IV  2 g Intravenous Q24H   And  . metronidazole  500 mg Intravenous Q8H  . heparin subcutaneous  5,000 Units Subcutaneous Q8H  . insulin aspart  0-15 Units Subcutaneous TID WC   . dextrose 5 % and 0.45 % NaCl with KCl 20 mEq/L 100 mL/hr (07/20/16 2142)   Assessment/Plan Acute purulent retroperitoneal appendicitis Laparoscopic appendectomy 07/20/16, Dr. Ezzard StandingNewman Diabetes Type II/Dyslipidemia BPH FEN: IV fluids/Carb mod diet ID: day 2 ceftriaxone/flagyl DVT:  Heparin  PLan:  Home after lunch, I want to make sure bleeding is not an issue.  Give lunch time flagyl dose.  Restart Metformin tomorrow AM.        LOS: 0 days    Sherrie GeorgeJENNINGS,Corey Caulfield 07/21/2016 [id number]

## 2016-07-21 NOTE — Progress Notes (Signed)
Pt's vitals are WNL, tolerating diet and pain is under control. Discussed discharge instructions with patient. Discharged to home with prescriptions and work note.

## 2016-07-21 NOTE — Discharge Instructions (Signed)
Laparoscopic Appendectomy, Adult, Care After °Refer to this sheet in the next few weeks. These instructions provide you with information on caring for yourself after your procedure. Your caregiver may also give you more specific instructions. Your treatment has been planned according to current medical practices, but problems sometimes occur. Call your caregiver if you have any problems or questions after your procedure. °HOME CARE INSTRUCTIONS °· Do not drive while taking narcotic pain medicines. °· Use stool softener if you become constipated from your pain medicines. °· Change your bandages (dressings) as directed. °· Keep your wounds clean and dry. You may wash the wounds gently with soap and water. Gently pat the wounds dry with a clean towel. °· Do not take baths, swim, or use hot tubs for 10 days, or as instructed by your caregiver. °· Only take over-the-counter or prescription medicines for pain, discomfort, or fever as directed by your caregiver. °· You may continue your normal diet as directed. °· Do not lift more than 10 pounds (4.5 kg) or play contact sports for 3 weeks, or as directed. °· Slowly increase your activity after surgery. °· Take deep breaths to avoid getting a lung infection (pneumonia). °SEEK MEDICAL CARE IF: °· You have redness, swelling, or increasing pain in your wounds. °· You have pus coming from your wounds. °· You have drainage from a wound that lasts longer than 1 day. °· You notice a bad smell coming from the wounds or dressing. °· Your wound edges break open after stitches (sutures) have been removed. °· You notice increasing pain in the shoulders (shoulder strap areas) or near your shoulder blades. °· You develop dizzy episodes or fainting while standing. °· You develop shortness of breath. °· You develop persistent nausea or vomiting. °· You cannot control your bowel functions or lose your appetite. °· You develop diarrhea. °SEEK IMMEDIATE MEDICAL CARE IF:  °· You have a  fever. °· You develop a rash. °· You have difficulty breathing or sharp pains in your chest. °· You develop any reaction or side effects to medicines given. °MAKE SURE YOU: °· Understand these instructions. °· Will watch your condition. °· Will get help right away if you are not doing well or get worse. °  °This information is not intended to replace advice given to you by your health care provider. Make sure you discuss any questions you have with your health care provider. °  °Document Released: 11/02/2005 Document Revised: 03/19/2015 Document Reviewed: 04/22/2015 °Elsevier Interactive Patient Education ©2016 [employer] ° °CCS ______[facility] °LAPAROSCOPIC SURGERY: POST OP INSTRUCTIONS °Always review your discharge instruction sheet given to you by the facility where your surgery was performed. °IF YOU HAVE DISABILITY OR FAMILY LEAVE FORMS, YOU MUST BRING THEM TO THE OFFICE FOR PROCESSING.   °DO NOT GIVE THEM TO YOUR DOCTOR. ° °1. A prescription for pain medication may be given to you upon discharge.  Take your pain medication as prescribed, if needed.  If narcotic pain medicine is not needed, then you may take acetaminophen (Tylenol) or ibuprofen (Advil) as needed. °2. Take your usually prescribed medications unless otherwise directed. °3. If you need a refill on your pain medication, please contact your pharmacy.  They will contact our office to request authorization. Prescriptions will not be filled after 5pm or on week-ends. °4. You should follow a light diet the first few days after arrival home, such as soup and crackers, etc.  Be sure to include lots of fluids daily. °5. Most   patients will experience some swelling and bruising in the area of the incisions.  Ice packs will help.  Swelling and bruising can take several days to resolve.  °6. It is common to experience some constipation if taking pain medication after surgery.  Increasing fluid intake and taking a stool softener (such as  Colace) will usually help or prevent this problem from occurring.  A mild laxative (Milk of Magnesia or Miralax) should be taken according to package instructions if there are no bowel movements after 48 hours. °7. Unless discharge instructions indicate otherwise, you may remove your bandages 24-48 hours after surgery, and you may shower at that time.  You may have steri-strips (small skin tapes) in place directly over the incision.  These strips should be left on the skin for 7-10 days.  If your surgeon used skin glue on the incision, you may shower in 24 hours.  The glue will flake off over the next 2-3 weeks.  Any sutures or staples will be removed at the office during your follow-up visit. °8. ACTIVITIES:  You may resume regular (light) daily activities beginning the next day--such as daily self-care, walking, climbing stairs--gradually increasing activities as tolerated.  You may have sexual intercourse when it is comfortable.  Refrain from any heavy lifting or straining until approved by your doctor. °a. You may drive when you are no longer taking prescription pain medication, you can comfortably wear a seatbelt, and you can safely maneuver your car and apply brakes. °b. RETURN TO WORK:  __________________________________________________________ °9. You should see your doctor in the office for a follow-up appointment approximately 2-3 weeks after your surgery.  Make sure that you call for this appointment within a day or two after you arrive home to insure a convenient appointment time. °10. OTHER INSTRUCTIONS: __________________________________________________________________________________________________________________________ __________________________________________________________________________________________________________________________ °WHEN TO CALL YOUR DOCTOR: °1. Fever over 101.0 °2. Inability to urinate °3. Continued bleeding from incision. °4. Increased pain, redness, or drainage from the  incision. °5. Increasing abdominal pain ° °The clinic staff is available to answer your questions during regular business hours.  Please don’t hesitate to call and ask to speak to one of the nurses for clinical concerns.  If you have a medical emergency, go to the nearest emergency room or call 911.  A surgeon from [facility] is always on call at the hospital. °[address], [address], [place], [place]  [address] ? [address] [address], [place], [place]   [address] °[phone] ? [phone] ? FAX [phone] °Web site: [email or web address] ° °

## 2016-07-22 NOTE — Discharge Summary (Signed)
Physician Discharge Summary  Patient ID: Richard Flowers MRN: [id number] DOB/AGE: Feb 20, 1971 45 y.o.  Admit date: 07/19/2016 Discharge date: 07/21/2016  Admission Diagnoses:  Appendicitis AODM - not insulin dependant BPH  Discharge Diagnoses:  Acute purulent retroperitoneal appendicitis Diabetes Type II/Dyslipidemia BPH  Active Problems:   Appendicitis   PROCEDURES: Laparoscopic appendectomy 07/20/16, Dr. Marty Flowers   [facility] Girlfriend, Richard Flowers, and mother, Richard Flowers, at bedside. He started having pain Saturday night.  He vomited x 3 Sunday AM.  He thought he may be constipated and took some oil laxative.  He has had no prior abdominal surgery.  He has no chronic GI complaints - no stomach, liver, or colon disease. CT scan of abdomen - 07/20/2016 - 1. Acute appendicitis, with dilatation of the appendix to 1.2 cm in diameter, with surrounding soft tissue inflammation and trace fluid. The appendix is partially retrocecal in nature. No evidence of perforation or abscess formation at this time.                       2. Mildly enlarged prostate noted.                       3. Small bilateral renal cysts seen. WBC - 07/19/2016 - 13,200 Pt was seen and admitted by Dr. Ezzard Flowers and taken to the OR that AM.  He had a purulent retrocecal appendix.  It was removed without difficulty.  He was mobilized, his diet was advanced and he did well post op.  He was sent home on POD1.   Condition on d/c:  Improved   CBC Latest Ref Rng & Units 07/19/2016  WBC 4.0 - 10.5 K/uL 13.2(H)  Hemoglobin 13.0 - 17.0 g/dL 44.015.6  Hematocrit 10.239.0 - 52.0 % 44.0  Platelets 150 - 400 K/uL 349   CMP Latest Ref Rng & Units 07/19/2016  Glucose 65 - 99 mg/dL 725(D291(H)  BUN 6 - 20 mg/dL 12  Creatinine 6.640.61 - 4.031.24 mg/dL 4.740.82  Sodium 259135 - 563145 mmol/L 132(L)  Potassium 3.5 - 5.1 mmol/L 4.3  Chloride 101 - 111 mmol/L 95(L)  CO2 22 - 32 mmol/L 27  Calcium 8.9 - 10.3 mg/dL 9.3     Disposition: [id number]-Home or Self Care     Medication List    TAKE these medications   acetaminophen 325 MG tablet Commonly known as:  TYLENOL You can take this or the prescription pain medicine every 4 hours.  This acetaminophen (Tylenol) is in your prescription medicine.  Don't exceed 4000 mg of this per day.   aspirin EC 81 MG tablet Take 81 mg by mouth daily.   HYDROcodone-acetaminophen 5-325 MG tablet Commonly known as:  NORCO/VICODIN Take 1-2 tablets by mouth every 4 (four) hours as needed for moderate pain.   metFORMIN 1000 MG tablet Commonly known as:  GLUCOPHAGE Restart Metformin in AM 07/22/16.  Continue taking 1000 mg twice a day as before your surgery. What changed:  how much to take  how to take this  when to take this  additional instructions   pravastatin 40 MG tablet Commonly known as:  PRAVACHOL      Follow-up Information    [facility] Follow up on 08/05/2016.   Specialty:  General Surgery Why:  Your appointment is at 9;30 AM, be at the office 30 minutes early for check in.   If you do not hear from our office tomorrow  call and confirm appointment. Contact information: [address] [place] [address] [phone]        Richard Balls, MD .   Specialty:  Family Medicine Why:  [facility] and let them know you had surgery and follow up on medical issues. Contact information: [address] [facility] [place] [address] [phone]           Signed: Sherrie Flowers 07/22/2016, 1:31 PM

## 2017-02-24 IMAGING — CT CT ABD-PELV W/ CM
2 of 5 series · 16 of 46 positions shown, 18 images · IV contrast (APPLIED)
Comparison: None.

CLINICAL DATA: Acute onset right lower quadrant abdominal pain and
cramping. Nausea and vomiting. Mild leukocytosis. Initial encounter.

EXAM:
CT ABDOMEN AND PELVIS WITH CONTRAST
TECHNIQUE: Multidetector CT imaging of the abdomen and pelvis was performed
using the standard protocol following bolus administration of
intravenous contrast.
CONTRAST:  100mL 6N8LCF-MGG IOPAMIDOL (6N8LCF-MGG) INJECTION 61%

[Series 2: axial st · axial · 0.73mm/px · z∈[-414,+6]mm · 13 of 94 slices shown, 15 images]
[im 5/94  soft-tissue]
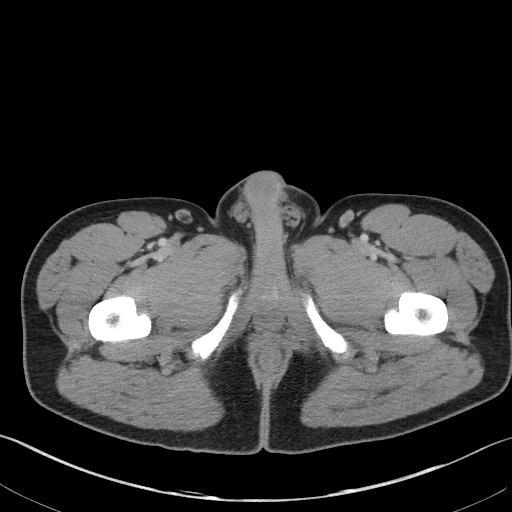
[im 5/94  bone]
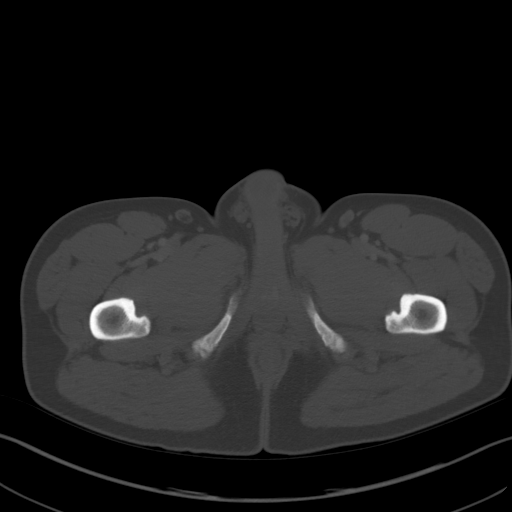
[im 14/94  soft-tissue]
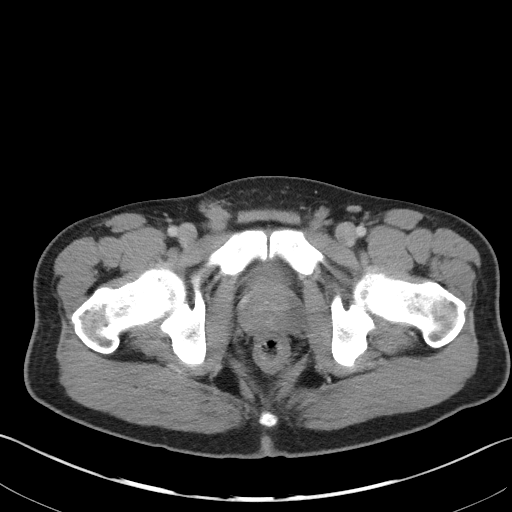
[im 19/94  soft-tissue]
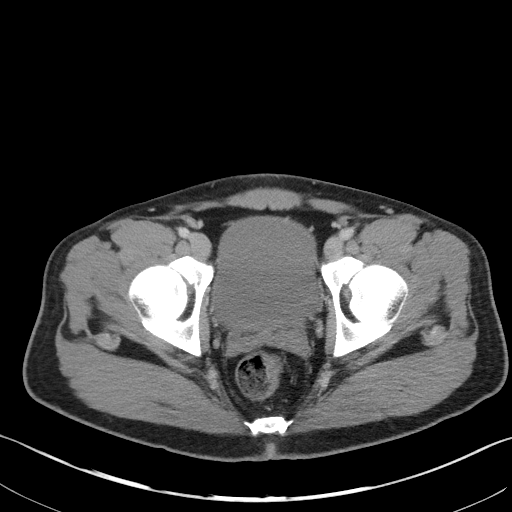
[im 28/94  soft-tissue]
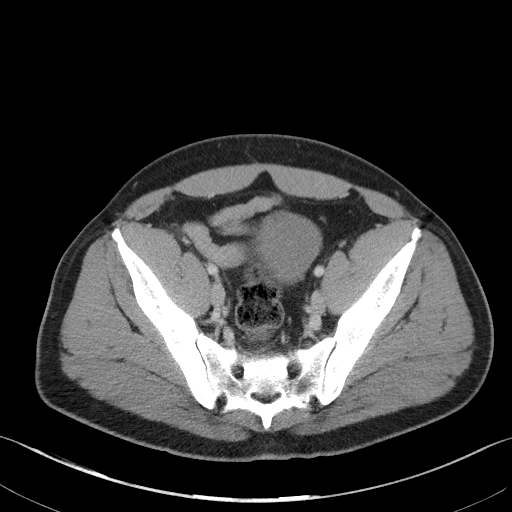
[im 33/94  soft-tissue]
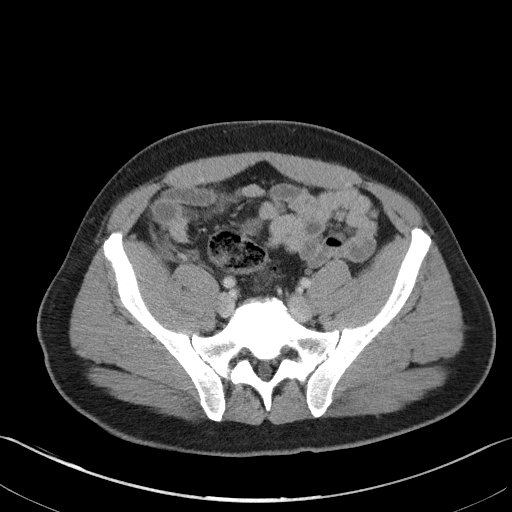
[im 42/94  soft-tissue]
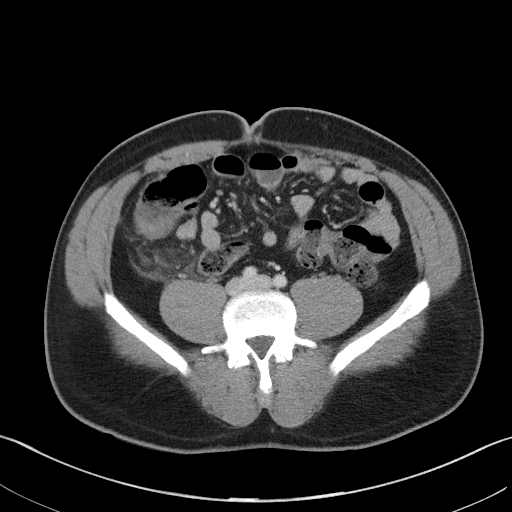
[im 47/94  soft-tissue]
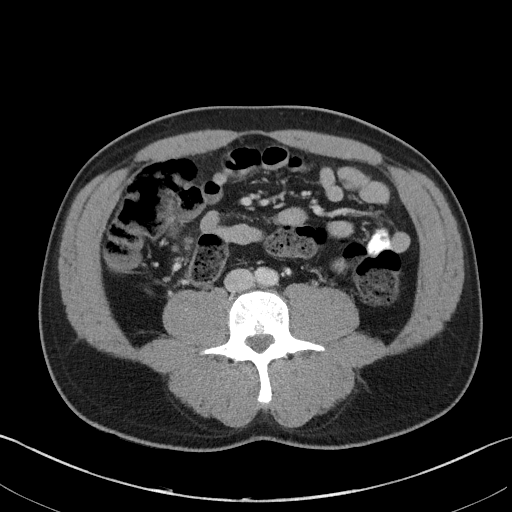
[im 52/94  soft-tissue]
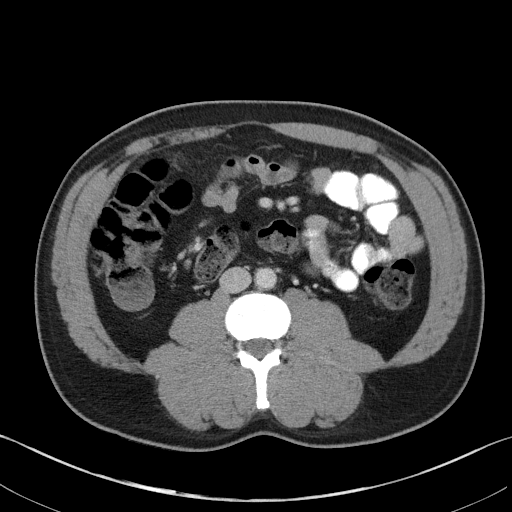
[im 61/94  soft-tissue]
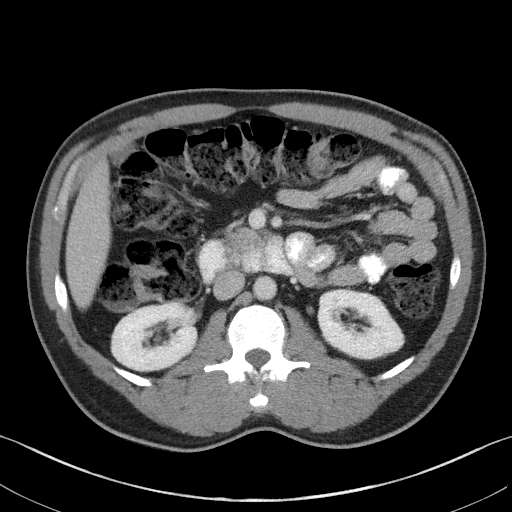
[im 61/94  bone]
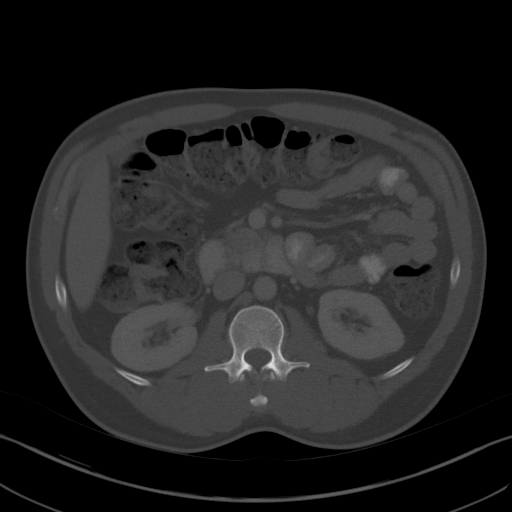
[im 66/94  soft-tissue]
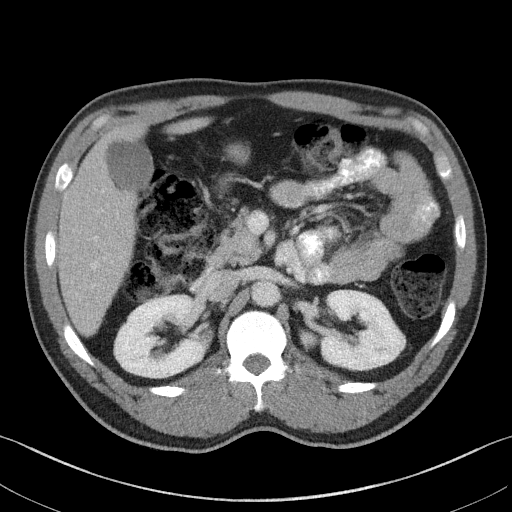
[im 75/94  soft-tissue]
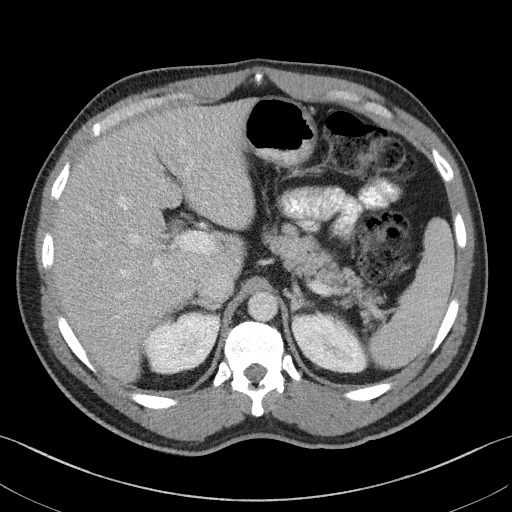
[im 80/94  soft-tissue]
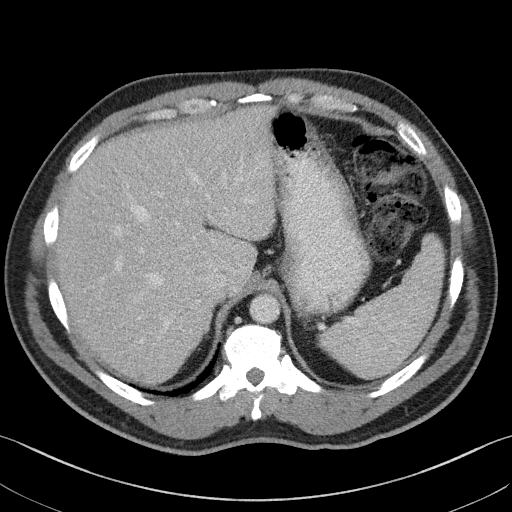
[im 89/94  soft-tissue]
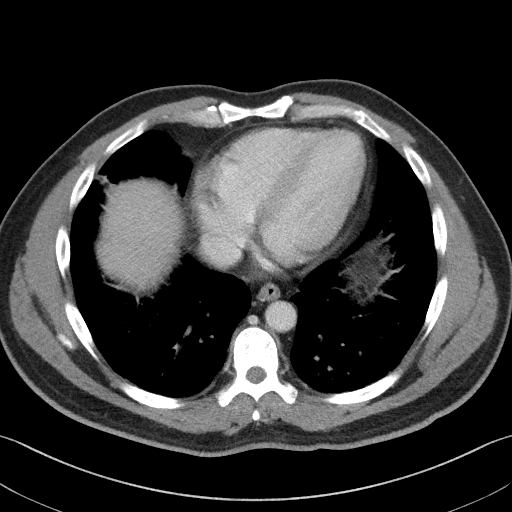

[Series 5: coronal st · coronal · 0.75mm/px · 3 of 101 slices shown]
[im 34/101  soft-tissue]
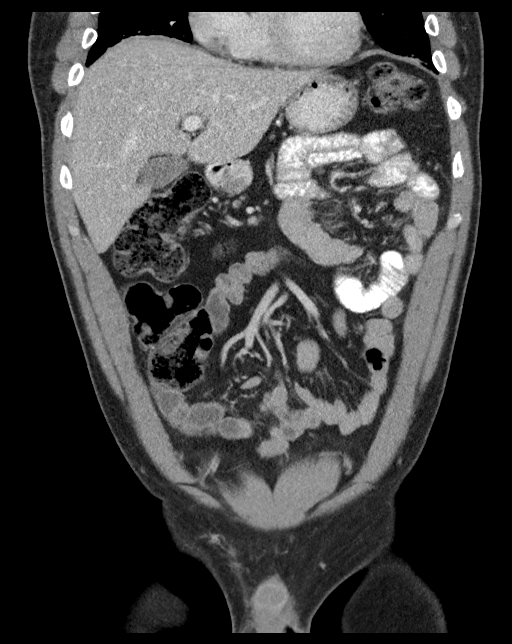
[im 45/101  soft-tissue]
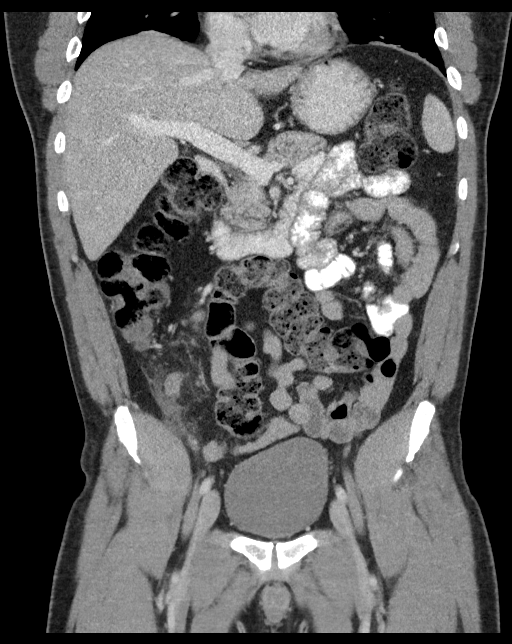
[im 56/101  soft-tissue]
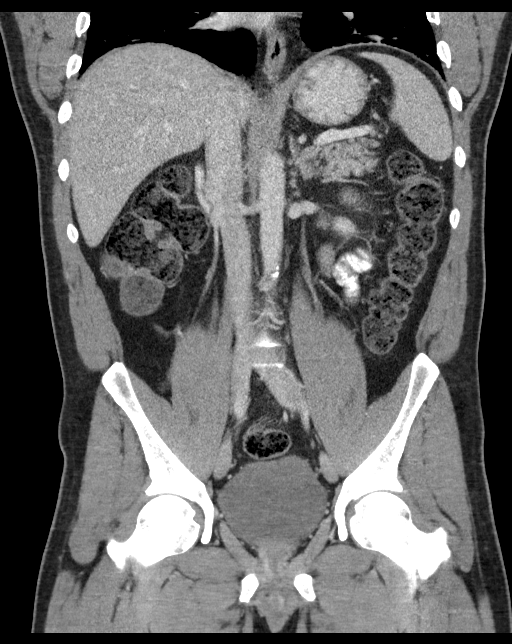

[16 of 46 positions shown; findings below may reference images not displayed]

FINDINGS: Minimal bibasilar atelectasis is noted.

The liver and spleen are unremarkable in appearance. The gallbladder
is within normal limits. The pancreas and adrenal glands are
unremarkable.

Small bilateral renal cysts are noted. There is no evidence of
hydronephrosis. No renal or ureteral stones are seen. No perinephric
stranding is appreciated.

No free fluid is identified. The small bowel is unremarkable in
appearance. The stomach is within normal limits. No acute vascular
abnormalities are seen. Minimal calcification is seen along the
abdominal aorta and its branches.

The appendix is dilated to 1.2 cm in diameter, with surrounding soft
tissue inflammation and trace fluid, compatible with acute
appendicitis. The appendix is partially retrocecal in nature. There
is no evidence of perforation or abscess formation at this time.

The colon is partially filled with stool and is unremarkable in
appearance.

The bladder is moderately distended and grossly unremarkable. The
prostate is mildly enlarged, measuring 5.0 cm in transverse
dimension. No inguinal lymphadenopathy is seen.

No acute osseous abnormalities are identified.
IMPRESSION: 1. Acute appendicitis, with dilatation of the appendix to 1.2 cm in
diameter, with surrounding soft tissue inflammation and trace fluid.
The appendix is partially retrocecal in nature. No evidence of
perforation or abscess formation at this time.
2. Mildly enlarged prostate noted.
3. Small bilateral renal cysts seen.
These results were called by telephone at the time of interpretation
on 07/20/2016 at [DATE] to Dr. LEAN SERRATOS, who verbally acknowledged
these results.

## 2023-03-26 ENCOUNTER — Other Ambulatory Visit (HOSPITAL_BASED_OUTPATIENT_CLINIC_OR_DEPARTMENT_OTHER): Payer: Self-pay

## 2023-03-26 MED ORDER — TRULICITY 3 MG/0.5ML ~~LOC~~ SOAJ
3.0000 mg | SUBCUTANEOUS | 3 refills | Status: AC
  Filled 2023-03-26: qty 2, 28d supply, fill #0

## 2023-04-09 ENCOUNTER — Other Ambulatory Visit (HOSPITAL_BASED_OUTPATIENT_CLINIC_OR_DEPARTMENT_OTHER): Payer: Self-pay
